# Patient Record
Sex: Male | Born: 1990 | Race: Black or African American | Hispanic: No | Marital: Single | Smoking: Former smoker
Health system: Southern US, Community
[De-identification: ages and names within clinical notes are randomized; demographics above are authoritative.]

## PROBLEM LIST (undated history)

## (undated) DIAGNOSIS — Y249XXD Unspecified firearm discharge, undetermined intent, subsequent encounter: Secondary | ICD-10-CM

## (undated) DIAGNOSIS — W3400XD Accidental discharge from unspecified firearms or gun, subsequent encounter: Secondary | ICD-10-CM

---

## 2010-08-12 ENCOUNTER — Emergency Department (HOSPITAL_COMMUNITY)
Admission: EM | Admit: 2010-08-12 | Discharge: 2010-08-12 | Payer: Self-pay | Source: Home / Self Care | Admitting: Emergency Medicine

## 2010-11-13 LAB — ETHANOL: Alcohol, Ethyl (B): 234 mg/dL — ABNORMAL HIGH (ref 0–10)

## 2012-09-18 ENCOUNTER — Emergency Department: Payer: Self-pay | Admitting: Emergency Medicine

## 2015-01-12 ENCOUNTER — Inpatient Hospital Stay (HOSPITAL_COMMUNITY)
Admission: EM | Admit: 2015-01-12 | Discharge: 2015-01-19 | DRG: 184 | Disposition: A | Discharge status: Home / Self Care | Payer: PRIVATE HEALTH INSURANCE | Attending: General Surgery | Admitting: General Surgery

## 2015-01-12 DIAGNOSIS — S299XXA Unspecified injury of thorax, initial encounter: Secondary | ICD-10-CM

## 2015-01-12 DIAGNOSIS — W3400XA Accidental discharge from unspecified firearms or gun, initial encounter: Secondary | ICD-10-CM

## 2015-01-12 DIAGNOSIS — S21139A Puncture wound without foreign body of unspecified front wall of thorax without penetration into thoracic cavity, initial encounter: Secondary | ICD-10-CM

## 2015-01-12 DIAGNOSIS — S2241XA Multiple fractures of ribs, right side, initial encounter for closed fracture: Principal | ICD-10-CM | POA: Diagnosis present

## 2015-01-12 DIAGNOSIS — M62838 Other muscle spasm: Secondary | ICD-10-CM | POA: Diagnosis present

## 2015-01-12 DIAGNOSIS — S41001A Unspecified open wound of right shoulder, initial encounter: Secondary | ICD-10-CM | POA: Diagnosis present

## 2015-01-12 DIAGNOSIS — S41039A Puncture wound without foreign body of unspecified shoulder, initial encounter: Secondary | ICD-10-CM

## 2015-01-12 DIAGNOSIS — J942 Hemothorax: Secondary | ICD-10-CM | POA: Diagnosis present

## 2015-01-12 DIAGNOSIS — D62 Acute posthemorrhagic anemia: Secondary | ICD-10-CM | POA: Diagnosis present

## 2015-01-12 DIAGNOSIS — T797XXA Traumatic subcutaneous emphysema, initial encounter: Secondary | ICD-10-CM | POA: Diagnosis present

## 2015-01-12 DIAGNOSIS — S279XXA Injury of unspecified intrathoracic organ, initial encounter: Secondary | ICD-10-CM | POA: Diagnosis present

## 2015-01-12 DIAGNOSIS — S272XXA Traumatic hemopneumothorax, initial encounter: Secondary | ICD-10-CM | POA: Diagnosis present

## 2015-01-12 DIAGNOSIS — R40241 Glasgow coma scale score 13-15: Secondary | ICD-10-CM | POA: Diagnosis present

## 2015-01-12 DIAGNOSIS — S71139A Puncture wound without foreign body, unspecified thigh, initial encounter: Secondary | ICD-10-CM

## 2015-01-12 DIAGNOSIS — S71101A Unspecified open wound, right thigh, initial encounter: Secondary | ICD-10-CM | POA: Diagnosis present

## 2015-01-13 ENCOUNTER — Emergency Department (HOSPITAL_COMMUNITY): Payer: PRIVATE HEALTH INSURANCE

## 2015-01-13 ENCOUNTER — Encounter (HOSPITAL_COMMUNITY): Payer: Self-pay | Admitting: Radiology

## 2015-01-13 DIAGNOSIS — M62838 Other muscle spasm: Secondary | ICD-10-CM | POA: Diagnosis present

## 2015-01-13 DIAGNOSIS — W3400XA Accidental discharge from unspecified firearms or gun, initial encounter: Secondary | ICD-10-CM

## 2015-01-13 DIAGNOSIS — S21139A Puncture wound without foreign body of unspecified front wall of thorax without penetration into thoracic cavity, initial encounter: Secondary | ICD-10-CM

## 2015-01-13 DIAGNOSIS — S41001A Unspecified open wound of right shoulder, initial encounter: Secondary | ICD-10-CM | POA: Diagnosis present

## 2015-01-13 DIAGNOSIS — R40241 Glasgow coma scale score 13-15: Secondary | ICD-10-CM | POA: Diagnosis present

## 2015-01-13 DIAGNOSIS — J942 Hemothorax: Secondary | ICD-10-CM | POA: Diagnosis present

## 2015-01-13 DIAGNOSIS — D62 Acute posthemorrhagic anemia: Secondary | ICD-10-CM | POA: Diagnosis present

## 2015-01-13 DIAGNOSIS — S2241XA Multiple fractures of ribs, right side, initial encounter for closed fracture: Secondary | ICD-10-CM | POA: Diagnosis present

## 2015-01-13 DIAGNOSIS — T797XXA Traumatic subcutaneous emphysema, initial encounter: Secondary | ICD-10-CM | POA: Diagnosis present

## 2015-01-13 DIAGNOSIS — S279XXA Injury of unspecified intrathoracic organ, initial encounter: Secondary | ICD-10-CM | POA: Diagnosis present

## 2015-01-13 DIAGNOSIS — S71101A Unspecified open wound, right thigh, initial encounter: Secondary | ICD-10-CM | POA: Diagnosis present

## 2015-01-13 LAB — BASIC METABOLIC PANEL
ANION GAP: 10 (ref 5–15)
BUN: 12 mg/dL (ref 6–20)
CO2: 22 mmol/L (ref 22–32)
Calcium: 7.7 mg/dL — ABNORMAL LOW (ref 8.9–10.3)
Chloride: 104 mmol/L (ref 101–111)
Creatinine, Ser: 1.35 mg/dL — ABNORMAL HIGH (ref 0.61–1.24)
GFR calc Af Amer: >60 mL/min (ref >60)
GFR calc non Af Amer: >60 mL/min (ref >60)
Glucose, Bld: 198 mg/dL — ABNORMAL HIGH (ref 65–99)
Potassium: 4.3 mmol/L (ref 3.5–5.1)
SODIUM: 136 mmol/L (ref 135–145)

## 2015-01-13 LAB — COMPREHENSIVE METABOLIC PANEL
ALT: 29 U/L (ref 17–63)
AST: 42 U/L — AB (ref 15–41)
Albumin: 3.8 g/dL (ref 3.5–5.0)
Alkaline Phosphatase: 57 U/L (ref 38–126)
Anion gap: 31 — ABNORMAL HIGH (ref 5–15)
BUN: 13 mg/dL (ref 6–20)
CALCIUM: 9 mg/dL (ref 8.9–10.3)
CO2: 8 mmol/L — ABNORMAL LOW (ref 22–32)
Chloride: 101 mmol/L (ref 101–111)
Creatinine, Ser: 2.07 mg/dL — ABNORMAL HIGH (ref 0.61–1.24)
GFR calc Af Amer: 50 mL/min — ABNORMAL LOW (ref >60)
GFR calc non Af Amer: 43 mL/min — ABNORMAL LOW (ref >60)
Glucose, Bld: 220 mg/dL — ABNORMAL HIGH (ref 65–99)
Potassium: 3.6 mmol/L (ref 3.5–5.1)
Sodium: 140 mmol/L (ref 135–145)
TOTAL PROTEIN: 6.5 g/dL (ref 6.5–8.1)
Total Bilirubin: 0.3 mg/dL (ref 0.3–1.2)

## 2015-01-13 LAB — PREPARE FRESH FROZEN PLASMA
UNIT DIVISION: 0
Unit division: 0

## 2015-01-13 LAB — CBC
HEMATOCRIT: 39.9 % (ref 39.0–52.0)
HEMATOCRIT: 42.2 % (ref 39.0–52.0)
HEMOGLOBIN: 13.1 g/dL (ref 13.0–17.0)
HEMOGLOBIN: 13.1 g/dL (ref 13.0–17.0)
MCH: 28.9 pg (ref 26.0–34.0)
MCH: 29 pg (ref 26.0–34.0)
MCHC: 31 g/dL (ref 30.0–36.0)
MCHC: 32.8 g/dL (ref 30.0–36.0)
MCV: 88.3 fL (ref 78.0–100.0)
MCV: 93.2 fL (ref 78.0–100.0)
Platelets: 218 10*3/uL (ref 150–400)
Platelets: 276 10*3/uL (ref 150–400)
RBC: 4.52 MIL/uL (ref 4.22–5.81)
RBC: 4.53 MIL/uL (ref 4.22–5.81)
RDW: 14.1 % (ref 11.5–15.5)
RDW: 15 % (ref 11.5–15.5)
WBC: 12.5 10*3/uL — ABNORMAL HIGH (ref 4.0–10.5)
WBC: 24.1 10*3/uL — ABNORMAL HIGH (ref 4.0–10.5)

## 2015-01-13 LAB — I-STAT CG4 LACTIC ACID, ED

## 2015-01-13 LAB — ABO/RH: ABO/RH(D): O POS

## 2015-01-13 LAB — ETHANOL: ALCOHOL ETHYL (B): 96 mg/dL — AB (ref <5)

## 2015-01-13 LAB — PROTIME-INR
INR: 1.22 (ref 0.00–1.49)
Prothrombin Time: 15.5 seconds — ABNORMAL HIGH (ref 11.6–15.2)

## 2015-01-13 LAB — MRSA PCR SCREENING: MRSA by PCR: NEGATIVE

## 2015-01-13 LAB — CDS SEROLOGY

## 2015-01-13 MED ORDER — OXYCODONE HCL 5 MG PO TABS
5.0000 mg | ORAL_TABLET | ORAL | Status: DC | PRN
  Administered 2015-01-13 – 2015-01-15 (×7): 5 mg via ORAL
  Filled 2015-01-13 (×7): qty 1

## 2015-01-13 MED ORDER — ONDANSETRON HCL 4 MG/2ML IJ SOLN: 4.0000 mg | Freq: Once | INTRAMUSCULAR | Status: AC

## 2015-01-13 MED ORDER — OXYCODONE HCL 5 MG PO TABS
10.0000 mg | ORAL_TABLET | ORAL | Status: DC | PRN
  Administered 2015-01-16 – 2015-01-17 (×4): 10 mg via ORAL
  Filled 2015-01-13 (×4): qty 2

## 2015-01-13 MED ORDER — CEFAZOLIN SODIUM-DEXTROSE 2-3 GM-% IV SOLR
2.0000 g | Freq: Once | INTRAVENOUS | Status: AC
  Administered 2015-01-13: 2 g via INTRAVENOUS
  Filled 2015-01-13: qty 50

## 2015-01-13 MED ORDER — FENTANYL CITRATE (PF) 100 MCG/2ML IJ SOLN
INTRAMUSCULAR | Status: AC | PRN
  Administered 2015-01-13: 100 ug via INTRAVENOUS

## 2015-01-13 MED ORDER — HYDROMORPHONE HCL 1 MG/ML IJ SOLN
1.0000 mg | INTRAMUSCULAR | Status: DC | PRN
  Administered 2015-01-13 (×5): 1 mg via INTRAVENOUS
  Administered 2015-01-14: 2 mg via INTRAVENOUS
  Administered 2015-01-14: 1 mg via INTRAVENOUS
  Administered 2015-01-14 (×2): 2 mg via INTRAVENOUS
  Administered 2015-01-14 – 2015-01-15 (×4): 1 mg via INTRAVENOUS
  Administered 2015-01-15: 2 mg via INTRAVENOUS
  Administered 2015-01-15 – 2015-01-16 (×3): 1 mg via INTRAVENOUS
  Filled 2015-01-13: qty 2
  Filled 2015-01-13 (×2): qty 1
  Filled 2015-01-13: qty 2
  Filled 2015-01-13: qty 1
  Filled 2015-01-13 (×2): qty 2
  Filled 2015-01-13 (×3): qty 1
  Filled 2015-01-13 (×2): qty 2
  Filled 2015-01-13: qty 1
  Filled 2015-01-13: qty 2
  Filled 2015-01-13: qty 1

## 2015-01-13 MED ORDER — SODIUM CHLORIDE 0.9 % IV SOLN
INTRAVENOUS | Status: AC | PRN
  Administered 2015-01-13 (×2): 1000 mL via INTRAVENOUS

## 2015-01-13 MED ORDER — IOHEXOL 300 MG/ML  SOLN
100.0000 mL | Freq: Once | INTRAMUSCULAR | Status: AC | PRN
  Administered 2015-01-13: 100 mL via INTRAVENOUS

## 2015-01-13 MED ORDER — PANTOPRAZOLE SODIUM 40 MG IV SOLR
40.0000 mg | Freq: Every day | INTRAVENOUS | Status: DC
  Administered 2015-01-13 – 2015-01-15 (×3): 40 mg via INTRAVENOUS
  Filled 2015-01-13 (×6): qty 40

## 2015-01-13 MED ORDER — PANTOPRAZOLE SODIUM 40 MG PO TBEC
40.0000 mg | DELAYED_RELEASE_TABLET | Freq: Every day | ORAL | Status: DC
  Administered 2015-01-16: 40 mg via ORAL
  Filled 2015-01-13: qty 1

## 2015-01-13 MED ORDER — ONDANSETRON HCL 4 MG/2ML IJ SOLN
INTRAMUSCULAR | Status: AC
  Filled 2015-01-13: qty 2

## 2015-01-13 MED ORDER — ONDANSETRON HCL 4 MG PO TABS: 4.0000 mg | ORAL_TABLET | Freq: Four times a day (QID) | ORAL | Status: DC | PRN

## 2015-01-13 MED ORDER — FENTANYL CITRATE (PF) 100 MCG/2ML IJ SOLN
INTRAMUSCULAR | Status: AC
  Filled 2015-01-13: qty 2

## 2015-01-13 MED ORDER — DEXTROSE IN LACTATED RINGERS 5 % IV SOLN
INTRAVENOUS | Status: DC
  Administered 2015-01-13 (×3): via INTRAVENOUS
  Administered 2015-01-14 (×2): 125 mL/h via INTRAVENOUS
  Administered 2015-01-15: 50 mL/h via INTRAVENOUS
  Filled 2015-01-13: qty 1000

## 2015-01-13 MED ORDER — ONDANSETRON HCL 4 MG/2ML IJ SOLN: 4.0000 mg | Freq: Four times a day (QID) | INTRAMUSCULAR | Status: DC | PRN

## 2015-01-13 MED ORDER — HYDROMORPHONE HCL 1 MG/ML IJ SOLN
INTRAMUSCULAR | Status: AC
  Filled 2015-01-13: qty 1

## 2015-01-13 MED ORDER — ACETAMINOPHEN 325 MG PO TABS
650.0000 mg | ORAL_TABLET | ORAL | Status: DC | PRN
  Administered 2015-01-14: 650 mg via ORAL
  Filled 2015-01-13: qty 2

## 2015-01-13 MED ORDER — METHOCARBAMOL 1000 MG/10ML IJ SOLN
500.0000 mg | Freq: Four times a day (QID) | INTRAVENOUS | Status: DC | PRN
  Administered 2015-01-13: 500 mg via INTRAVENOUS
  Filled 2015-01-13 (×3): qty 5
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
## 2015-01-13 NOTE — Progress Notes (Signed)
UR completed.  Pt likely to need DME for d/c.  Will not qualify for HHPT/OT as he has no fractures or brain injury that would allow for Medicaid level of HH.  CM to follow for DME needs/recommendations.  Carlyle LipaMichelle Donald Memoli, RN BSN MHA CCM Trauma/Neuro ICU Case Manager [phone]
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
## 2015-01-14 ENCOUNTER — Inpatient Hospital Stay (HOSPITAL_COMMUNITY): Payer: PRIVATE HEALTH INSURANCE

## 2015-01-14 LAB — CBC WITH DIFFERENTIAL/PLATELET
Basophils Absolute: 0 10*3/uL (ref 0.0–0.1)
Basophils Relative: 0 % (ref 0–1)
Eosinophils Absolute: 0 10*3/uL (ref 0.0–0.7)
Eosinophils Relative: 0 % (ref 0–5)
HCT: 26.4 % — ABNORMAL LOW (ref 39.0–52.0)
Hemoglobin: 8.8 g/dL — ABNORMAL LOW (ref 13.0–17.0)
LYMPHS ABS: 2.7 10*3/uL (ref 0.7–4.0)
LYMPHS PCT: 23 % (ref 12–46)
MCH: 29.4 pg (ref 26.0–34.0)
MCHC: 33.3 g/dL (ref 30.0–36.0)
MCV: 88.3 fL (ref 78.0–100.0)
MONO ABS: 0.8 10*3/uL (ref 0.1–1.0)
MONOS PCT: 7 % (ref 3–12)
Neutro Abs: 8 10*3/uL — ABNORMAL HIGH (ref 1.7–7.7)
Neutrophils Relative %: 69 % (ref 43–77)
PLATELETS: 160 10*3/uL (ref 150–400)
RBC: 2.99 MIL/uL — ABNORMAL LOW (ref 4.22–5.81)
RDW: 15 % (ref 11.5–15.5)
WBC: 11.5 10*3/uL — ABNORMAL HIGH (ref 4.0–10.5)

## 2015-01-14 LAB — BASIC METABOLIC PANEL
ANION GAP: 7 (ref 5–15)
BUN: 7 mg/dL (ref 6–20)
CALCIUM: 8 mg/dL — AB (ref 8.9–10.3)
CO2: 28 mmol/L (ref 22–32)
Chloride: 97 mmol/L — ABNORMAL LOW (ref 101–111)
Creatinine, Ser: 1.31 mg/dL — ABNORMAL HIGH (ref 0.61–1.24)
GFR calc Af Amer: >60 mL/min (ref >60)
GFR calc non Af Amer: >60 mL/min (ref >60)
GLUCOSE: 152 mg/dL — AB (ref 65–99)
Potassium: 3.7 mmol/L (ref 3.5–5.1)
SODIUM: 132 mmol/L — AB (ref 135–145)

## 2015-01-14 LAB — CBC
HEMATOCRIT: 21 % — AB (ref 39.0–52.0)
Hemoglobin: 6.9 g/dL — CL (ref 13.0–17.0)
MCH: 29.2 pg (ref 26.0–34.0)
MCHC: 32.9 g/dL (ref 30.0–36.0)
MCV: 89 fL (ref 78.0–100.0)
Platelets: 136 10*3/uL — ABNORMAL LOW (ref 150–400)
RBC: 2.36 MIL/uL — ABNORMAL LOW (ref 4.22–5.81)
RDW: 14.5 % (ref 11.5–15.5)
WBC: 11.2 10*3/uL — ABNORMAL HIGH (ref 4.0–10.5)

## 2015-01-14 LAB — BLOOD PRODUCT ORDER (VERBAL) VERIFICATION

## 2015-01-14 MED ORDER — ACETAMINOPHEN 325 MG PO TABS: 650.0000 mg | ORAL_TABLET | ORAL | Status: DC | PRN

## 2015-01-14 MED ORDER — SODIUM CHLORIDE 0.9 % IV SOLN
Freq: Once | INTRAVENOUS | Status: AC
  Administered 2015-01-14: 22:00:00 via INTRAVENOUS

## 2015-01-14 MED ORDER — METOPROLOL TARTRATE 1 MG/ML IV SOLN
5.0000 mg | Freq: Once | INTRAVENOUS | Status: AC
  Administered 2015-01-14: 5 mg via INTRAVENOUS
  Filled 2015-01-14: qty 5

## 2015-01-14 MED ORDER — LACTATED RINGERS IV BOLUS (SEPSIS)
1000.0000 mL | Freq: Once | INTRAVENOUS | Status: AC
  Administered 2015-01-14: 1000 mL via INTRAVENOUS
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
## 2015-01-15 ENCOUNTER — Inpatient Hospital Stay (HOSPITAL_COMMUNITY): Payer: PRIVATE HEALTH INSURANCE

## 2015-01-15 LAB — TYPE AND SCREEN
ABO/RH(D): O POS
Antibody Screen: NEGATIVE
UNIT DIVISION: 0
Unit division: 0
Unit division: 0
Unit division: 0

## 2015-01-15 LAB — CBC
HCT: 29.3 % — ABNORMAL LOW (ref 39.0–52.0)
Hemoglobin: 9.7 g/dL — ABNORMAL LOW (ref 13.0–17.0)
MCH: 28.6 pg (ref 26.0–34.0)
MCHC: 33.1 g/dL (ref 30.0–36.0)
MCV: 86.4 fL (ref 78.0–100.0)
Platelets: 160 10*3/uL (ref 150–400)
RBC: 3.39 MIL/uL — ABNORMAL LOW (ref 4.22–5.81)
RDW: 15.4 % (ref 11.5–15.5)
WBC: 10.6 10*3/uL — ABNORMAL HIGH (ref 4.0–10.5)

## 2015-01-15 LAB — PREPARE RBC (CROSSMATCH)

## 2015-01-15 MED ORDER — BACITRACIN-NEOMYCIN-POLYMYXIN OINTMENT TUBE
1.0000 "application " | TOPICAL_OINTMENT | Freq: Two times a day (BID) | CUTANEOUS | Status: DC
  Administered 2015-01-15 – 2015-01-19 (×9): 1 via TOPICAL
  Filled 2015-01-15 (×2): qty 15

## 2015-01-15 MED ORDER — DOCUSATE SODIUM 100 MG PO CAPS
100.0000 mg | ORAL_CAPSULE | Freq: Two times a day (BID) | ORAL | Status: DC
  Administered 2015-01-15 – 2015-01-19 (×9): 100 mg via ORAL
  Filled 2015-01-15 (×9): qty 1

## 2015-01-15 MED ORDER — NICOTINE 14 MG/24HR TD PT24
14.0000 mg | MEDICATED_PATCH | Freq: Every day | TRANSDERMAL | Status: DC
  Administered 2015-01-15 – 2015-01-19 (×6): 14 mg via TRANSDERMAL
  Filled 2015-01-15 (×6): qty 1
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
## 2015-01-15 NOTE — Progress Notes (Signed)
CRITICAL VALUE ALERT  Critical value received:  HGB of 6.9  Date of notification:  01/14/15  Time of notification:  2100  Critical value read back: Yes  Nurse who received alert:  Lily Peerhomas Nayden Czajka   MD notified (1st page):  Dr. Donell BeersByerly  Time of first page:  2100  Responding MD:  Donell BeersByerly  Time MD responded:  2105  Orders received.

## 2015-01-16 ENCOUNTER — Inpatient Hospital Stay (HOSPITAL_COMMUNITY): Payer: PRIVATE HEALTH INSURANCE

## 2015-01-16 DIAGNOSIS — D62 Acute posthemorrhagic anemia: Secondary | ICD-10-CM | POA: Diagnosis not present

## 2015-01-16 DIAGNOSIS — S272XXA Traumatic hemopneumothorax, initial encounter: Secondary | ICD-10-CM | POA: Diagnosis present

## 2015-01-16 DIAGNOSIS — S2241XA Multiple fractures of ribs, right side, initial encounter for closed fracture: Secondary | ICD-10-CM | POA: Diagnosis present

## 2015-01-16 LAB — BASIC METABOLIC PANEL
ANION GAP: 8 (ref 5–15)
BUN: <5 mg/dL — ABNORMAL LOW (ref 6–20)
CO2: 29 mmol/L (ref 22–32)
CREATININE: 1.01 mg/dL (ref 0.61–1.24)
Calcium: 8.5 mg/dL — ABNORMAL LOW (ref 8.9–10.3)
Chloride: 97 mmol/L — ABNORMAL LOW (ref 101–111)
GFR calc Af Amer: >60 mL/min (ref >60)
GFR calc non Af Amer: >60 mL/min (ref >60)
Glucose, Bld: 118 mg/dL — ABNORMAL HIGH (ref 65–99)
Potassium: 3.8 mmol/L (ref 3.5–5.1)
Sodium: 134 mmol/L — ABNORMAL LOW (ref 135–145)

## 2015-01-16 LAB — CBC
HEMATOCRIT: 32.3 % — AB (ref 39.0–52.0)
Hemoglobin: 10.6 g/dL — ABNORMAL LOW (ref 13.0–17.0)
MCH: 28.3 pg (ref 26.0–34.0)
MCHC: 32.8 g/dL (ref 30.0–36.0)
MCV: 86.4 fL (ref 78.0–100.0)
Platelets: 175 10*3/uL (ref 150–400)
RBC: 3.74 MIL/uL — AB (ref 4.22–5.81)
RDW: 14.8 % (ref 11.5–15.5)
WBC: 10.9 10*3/uL — AB (ref 4.0–10.5)

## 2015-01-16 LAB — MAGNESIUM: Magnesium: 1.7 mg/dL (ref 1.7–2.4)

## 2015-01-16 MED ORDER — BACITRACIN-NEOMYCIN-POLYMYXIN 400-5-5000 EX OINT
TOPICAL_OINTMENT | CUTANEOUS | Status: AC
  Filled 2015-01-16: qty 1

## 2015-01-16 MED ORDER — SODIUM CHLORIDE 0.9 % IJ SOLN: 3.0000 mL | INTRAMUSCULAR | Status: DC | PRN

## 2015-01-16 NOTE — Progress Notes (Signed)
UR completed.  No HH needs anticipated at d/c.  Carlyle LipaMichelle Cozetta Seif, RN BSN MHA CCM Trauma/Neuro ICU Case Manager [phone]

## 2015-01-16 NOTE — Progress Notes (Signed)
Patient to transfer to [id number] report given to receiving nurse Aurea GraffJoan, all questions answered at this time.  Pt. VSS with no s/s of distress noted.  Pt. Stable at transfer.
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
## 2015-01-17 ENCOUNTER — Inpatient Hospital Stay (HOSPITAL_COMMUNITY): Payer: PRIVATE HEALTH INSURANCE

## 2015-01-17 ENCOUNTER — Encounter (HOSPITAL_COMMUNITY): Payer: Self-pay | Admitting: *Deleted

## 2015-01-17 DIAGNOSIS — S41039A Puncture wound without foreign body of unspecified shoulder, initial encounter: Secondary | ICD-10-CM

## 2015-01-17 DIAGNOSIS — W3400XA Accidental discharge from unspecified firearms or gun, initial encounter: Secondary | ICD-10-CM

## 2015-01-17 DIAGNOSIS — S71139A Puncture wound without foreign body, unspecified thigh, initial encounter: Secondary | ICD-10-CM

## 2015-01-17 LAB — CBC WITH DIFFERENTIAL/PLATELET
Basophils Absolute: 0 10*3/uL (ref 0.0–0.1)
Basophils Relative: 0 % (ref 0–1)
EOS PCT: 1 % (ref 0–5)
Eosinophils Absolute: 0.2 10*3/uL (ref 0.0–0.7)
HCT: 32.2 % — ABNORMAL LOW (ref 39.0–52.0)
HEMOGLOBIN: 10.6 g/dL — AB (ref 13.0–17.0)
LYMPHS ABS: 2.6 10*3/uL (ref 0.7–4.0)
Lymphocytes Relative: 22 % (ref 12–46)
MCH: 28.6 pg (ref 26.0–34.0)
MCHC: 32.9 g/dL (ref 30.0–36.0)
MCV: 86.8 fL (ref 78.0–100.0)
Monocytes Absolute: 1.6 10*3/uL — ABNORMAL HIGH (ref 0.1–1.0)
Monocytes Relative: 14 % — ABNORMAL HIGH (ref 3–12)
Neutro Abs: 7.3 10*3/uL (ref 1.7–7.7)
Neutrophils Relative %: 63 % (ref 43–77)
Platelets: 265 10*3/uL (ref 150–400)
RBC: 3.71 MIL/uL — AB (ref 4.22–5.81)
RDW: 14.3 % (ref 11.5–15.5)
WBC: 11.6 10*3/uL — AB (ref 4.0–10.5)

## 2015-01-17 MED ORDER — ENOXAPARIN SODIUM 40 MG/0.4ML ~~LOC~~ SOLN
40.0000 mg | Freq: Every day | SUBCUTANEOUS | Status: DC
  Administered 2015-01-17 – 2015-01-19 (×3): 40 mg via SUBCUTANEOUS
  Filled 2015-01-17 (×3): qty 0.4

## 2015-01-17 MED ORDER — POLYETHYLENE GLYCOL 3350 17 G PO PACK
17.0000 g | PACK | Freq: Every day | ORAL | Status: DC
  Administered 2015-01-17 – 2015-01-19 (×3): 17 g via ORAL
  Filled 2015-01-17 (×3): qty 1

## 2015-01-17 MED ORDER — BACITRACIN-NEOMYCIN-POLYMYXIN 400-5-5000 EX OINT
TOPICAL_OINTMENT | CUTANEOUS | Status: AC
  Filled 2015-01-17: qty 1

## 2015-01-17 MED ORDER — HYDROMORPHONE HCL 1 MG/ML IJ SOLN
0.5000 mg | INTRAMUSCULAR | Status: DC | PRN
  Administered 2015-01-18 – 2015-01-19 (×2): 0.5 mg via INTRAVENOUS
  Filled 2015-01-17 (×2): qty 1

## 2015-01-17 MED ORDER — OXYCODONE HCL 5 MG PO TABS
5.0000 mg | ORAL_TABLET | ORAL | Status: DC | PRN
  Administered 2015-01-17 – 2015-01-18 (×6): 15 mg via ORAL
  Administered 2015-01-19: 10 mg via ORAL
  Administered 2015-01-19 (×2): 15 mg via ORAL
  Filled 2015-01-17 (×8): qty 3
  Filled 2015-01-17: qty 2
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
## 2015-01-18 ENCOUNTER — Inpatient Hospital Stay (HOSPITAL_COMMUNITY): Payer: PRIVATE HEALTH INSURANCE

## 2015-01-18 NOTE — Clinical Social Work Note (Signed)
CSW provided patient with packet of information on acute stress reaction. Per PT patient should not have any PT needs at discharge.   Roddie McBryant Saphronia Ozdemir MSW, EdnaLCSWA, YetterLCASA, [phone]
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
## 2015-01-19 ENCOUNTER — Inpatient Hospital Stay (HOSPITAL_COMMUNITY): Payer: PRIVATE HEALTH INSURANCE

## 2015-01-19 MED ORDER — OXYCODONE-ACETAMINOPHEN 7.5-325 MG PO TABS: 1.0000 | ORAL_TABLET | ORAL | Status: DC | PRN
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
## 2015-01-19 NOTE — Discharge Instructions (Signed)
Leave chest dressing on until Saturday, then Wash wounds daily in shower with soap and water. Do not soak. Apply antibiotic ointment (e.g. Neosporin) twice daily and as needed to keep moist. Cover with dry dressing.  No driving while taking oxycodone.
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
## 2015-01-19 NOTE — Progress Notes (Signed)
Chest tube incision draining. New dressing applied to site. Patient discharged home. Patient  understands discharge instructions instructions.

## 2015-01-25 ENCOUNTER — Encounter (HOSPITAL_COMMUNITY): Payer: Self-pay | Admitting: Emergency Medicine

## 2015-01-25 ENCOUNTER — Emergency Department (INDEPENDENT_AMBULATORY_CARE_PROVIDER_SITE_OTHER): Payer: Self-pay

## 2015-01-25 ENCOUNTER — Emergency Department (INDEPENDENT_AMBULATORY_CARE_PROVIDER_SITE_OTHER)
Admission: EM | Admit: 2015-01-25 | Discharge: 2015-01-25 | Disposition: A | Discharge status: Home / Self Care | Payer: PRIVATE HEALTH INSURANCE | Source: Home / Self Care | Attending: Family Medicine | Admitting: Family Medicine

## 2015-01-25 DIAGNOSIS — S71101S Unspecified open wound, right thigh, sequela: Secondary | ICD-10-CM | POA: Diagnosis not present

## 2015-01-25 DIAGNOSIS — W3400XS Accidental discharge from unspecified firearms or gun, sequela: Secondary | ICD-10-CM | POA: Diagnosis not present

## 2015-01-25 DIAGNOSIS — S71131S Puncture wound without foreign body, right thigh, sequela: Secondary | ICD-10-CM

## 2015-01-25 DIAGNOSIS — S41031S Puncture wound without foreign body of right shoulder, sequela: Principal | ICD-10-CM

## 2015-01-25 DIAGNOSIS — S41001S Unspecified open wound of right shoulder, sequela: Secondary | ICD-10-CM | POA: Diagnosis not present

## 2015-01-25 DIAGNOSIS — S2241XS Multiple fractures of ribs, right side, sequela: Secondary | ICD-10-CM

## 2015-01-25 HISTORY — DX: Accidental discharge from unspecified firearms or gun, subsequent encounter: W34.00XD

## 2015-01-25 HISTORY — DX: Unspecified firearm discharge, undetermined intent, subsequent encounter: Y24.9XXD

## 2015-01-25 MED ORDER — NAPROXEN 500 MG PO TABS: 500.0000 mg | ORAL_TABLET | Freq: Two times a day (BID) | ORAL | Status: DC

## 2015-01-25 MED ORDER — OXYCODONE-ACETAMINOPHEN 7.5-325 MG PO TABS
1.0000 | ORAL_TABLET | Freq: Four times a day (QID) | ORAL | Status: DC | PRN
Start: 2015-01-25 — End: 2019-06-07

## 2015-01-25 NOTE — Discharge Instructions (Signed)
Thank you for coming in today. Call [phone] to get a trauma appointment.  Return as needed.  Use the incentive spirometer every 15 minutes while awake. Go to the emergency room if he developed a fever.  Atelectasis Atelectasis is a collapse of the small air sacs in the lungs (alveoli). When this occurs, all or part of a lung collapses and becomes airless. It can be caused by various things and is a common problem after surgery. The severity of atelectasis will vary depending on the size of the area involved and the underlying cause of the condition. CAUSES  There are multiple causes for atelectasis:   Shallow breathing, particularly if there is an injury to your chest wall or abdomen that makes it painful to take a deep breath. This commonly occurs after surgery.  Obstruction of your airways (bronchi or bronchioles). This may be caused by a buildup of mucus (mucus plug), tumors, blood clots (pulmonary embolus), or inhaled foreign bodies. Mucus plugs occur when the lungs do not expand enough to get rid of mucus.  Outside pressure on the lung. This may be caused by tumors, fluid (pleural effusion), or a leakage of air between the lung and rib cage (pneumothorax).   Infections such as pneumonia.  Scarring in lung tissue left over from previous infection or injury.  Some diseases such as cystic fibrosis. SIGNS AND SYMPTOMS  Often, atelectasis will have no symptoms. When symptoms occur, they include:  Shortness of breath.   Bluish color to your nails, lips, or mouth (cyanosis). DIAGNOSIS  Your health care provider may suspect atelectasis based on symptoms and physical findings. A chest X-ray may be done to confirm the diagnosis. More specialized X-ray exams are sometimes required.  TREATMENT  Treatment will depend on the cause of the atelectasis. Treatment may include:  Purposeful coughing to loosen mucus plugs in the lungs.  Chest physiotherapy. This consists of clapping or  percussion on the chest over the lungs to further loosen mucus plugs.  Postural drainage techniques. This involves positioning your body so your head is lower than your chest. HOME CARE INSTRUCTIONS  Practice relaxed deep breathing whenever you are sitting down. A good technique is to take a few relaxed deep breaths each time a commercial comes on if you are watching television.  If you were given a deep breathing device (such as an incentive spirometer) or a mucus clearance device, use this regularly as directed by your health care provider.  Try to cough several times a day as directed by your health care provider.  Perform any chest physiotherapy or postural drainage techniques as directed by your health care provider. If necessary, have someone (such as a family member) assist you with these techniques.  When lying down, lie on the unaffected side to encourage mucus drainage.  Stay physically active as much as possible. SEEK IMMEDIATE MEDICAL CARE IF:   You develop increasing problems with your breathing.   You develop severe chest pain.   You develop severe coughing, or you cough up blood.   You have a fever or persistent symptoms for more than 2-3 days.   You have a fever and your symptoms suddenly get worse.  MAKE SURE YOU:  Understand these instructions.  Will watch your condition.  Will get help right away if you are not doing well or get worse. Document Released: 08/19/2005 Document Revised: 08/24/2013 Document Reviewed: 02/24/2013 [facility] Patient Information 2015 [employer] [place]. This information is not intended to replace advice given to you  by your health care provider. Make sure you discuss any questions you have with your health care provider. ° °
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
## 2015-01-26 ENCOUNTER — Emergency Department (HOSPITAL_COMMUNITY): Payer: PRIVATE HEALTH INSURANCE

## 2015-01-26 ENCOUNTER — Emergency Department (HOSPITAL_COMMUNITY)
Admission: EM | Admit: 2015-01-26 | Discharge: 2015-01-26 | Disposition: A | Discharge status: Home / Self Care | Payer: PRIVATE HEALTH INSURANCE | Attending: Emergency Medicine | Admitting: Emergency Medicine

## 2015-01-26 ENCOUNTER — Encounter (HOSPITAL_COMMUNITY): Payer: Self-pay | Admitting: *Deleted

## 2015-01-26 DIAGNOSIS — Z87828 Personal history of other (healed) physical injury and trauma: Secondary | ICD-10-CM | POA: Diagnosis not present

## 2015-01-26 DIAGNOSIS — R079 Chest pain, unspecified: Secondary | ICD-10-CM

## 2015-01-26 DIAGNOSIS — Z720 Tobacco use: Secondary | ICD-10-CM | POA: Diagnosis not present

## 2015-01-26 LAB — BASIC METABOLIC PANEL
ANION GAP: 9 (ref 5–15)
BUN: 15 mg/dL (ref 6–20)
CALCIUM: 9.2 mg/dL (ref 8.9–10.3)
CHLORIDE: 107 mmol/L (ref 101–111)
CO2: 25 mmol/L (ref 22–32)
CREATININE: 1.03 mg/dL (ref 0.61–1.24)
Glucose, Bld: 118 mg/dL — ABNORMAL HIGH (ref 65–99)
Potassium: 4 mmol/L (ref 3.5–5.1)
Sodium: 141 mmol/L (ref 135–145)

## 2015-01-26 LAB — CBC WITH DIFFERENTIAL/PLATELET
BASOS ABS: 0 10*3/uL (ref 0.0–0.1)
BASOS PCT: 0 % (ref 0–1)
EOS ABS: 0.3 10*3/uL (ref 0.0–0.7)
Eosinophils Relative: 2 % (ref 0–5)
HCT: 37.6 % — ABNORMAL LOW (ref 39.0–52.0)
Hemoglobin: 11.6 g/dL — ABNORMAL LOW (ref 13.0–17.0)
LYMPHS PCT: 19 % (ref 12–46)
Lymphs Abs: 2.6 10*3/uL (ref 0.7–4.0)
MCH: 27.6 pg (ref 26.0–34.0)
MCHC: 30.9 g/dL (ref 30.0–36.0)
MCV: 89.3 fL (ref 78.0–100.0)
MONOS PCT: 4 % (ref 3–12)
Monocytes Absolute: 0.6 10*3/uL (ref 0.1–1.0)
NEUTROS PCT: 75 % (ref 43–77)
Neutro Abs: 10.4 10*3/uL — ABNORMAL HIGH (ref 1.7–7.7)
Platelets: 580 10*3/uL — ABNORMAL HIGH (ref 150–400)
RBC: 4.21 MIL/uL — AB (ref 4.22–5.81)
RDW: 14.8 % (ref 11.5–15.5)
WBC: 13.9 10*3/uL — ABNORMAL HIGH (ref 4.0–10.5)

## 2015-01-26 MED ORDER — HYDROMORPHONE HCL 1 MG/ML IJ SOLN
1.0000 mg | Freq: Once | INTRAMUSCULAR | Status: AC
  Administered 2015-01-26: 1 mg via INTRAMUSCULAR
  Filled 2015-01-26: qty 1

## 2015-01-26 MED ORDER — IOHEXOL 350 MG/ML SOLN
100.0000 mL | Freq: Once | INTRAVENOUS | Status: AC | PRN
  Administered 2015-01-26: 100 mL via INTRAVENOUS
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
## 2015-01-26 NOTE — Progress Notes (Signed)
Pt stated [facility] [place] [facility] and will return if need CM spoke with pt who [place].   CM discussed and provided written information for self pay pcps, discussed the importance of pcp vs EDP services for f/u care, www.needymeds.org, www.goodrx.com, discounted pharmacies and other [place] resources such as [facility] , [facility], affordable care act,  [place] med assist, financial assistance, self pay dental services, [place] med assist, DSS and  health department  Reviewed resources for [place] self pay pcps like [facility], [facility] [address], [facility], [facility], local urgent care centers, [facility], [facility], general medical clinics, [facility], [facility] plus others, medication resources, [facility] out patient pharmacies and housing Pt voiced understanding and appreciation of resources provided   Provided [facility] contact information
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
## 2018-06-06 ENCOUNTER — Other Ambulatory Visit: Payer: Self-pay

## 2018-06-06 ENCOUNTER — Emergency Department (HOSPITAL_COMMUNITY)
Admission: EM | Admit: 2018-06-06 | Discharge: 2018-06-06 | Disposition: A | Discharge status: Home / Self Care | Payer: PRIVATE HEALTH INSURANCE | Attending: Emergency Medicine | Admitting: Emergency Medicine

## 2018-06-06 ENCOUNTER — Encounter (HOSPITAL_COMMUNITY): Payer: Self-pay

## 2018-06-06 DIAGNOSIS — Z202 Contact with and (suspected) exposure to infections with a predominantly sexual mode of transmission: Secondary | ICD-10-CM | POA: Insufficient documentation

## 2018-06-06 DIAGNOSIS — F17210 Nicotine dependence, cigarettes, uncomplicated: Secondary | ICD-10-CM | POA: Insufficient documentation

## 2018-06-06 NOTE — ED Notes (Signed)
Patient verbalizes understanding of discharge instructions. Opportunity for questioning and answers were provided. Armband removed by staff, pt discharged from ED.  
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
## 2018-06-06 NOTE — Discharge Instructions (Signed)
Please follow up with PCP as needed. Please return to the ED if you experience any of the signs/symptoms we discussed.

## 2018-06-06 NOTE — ED Triage Notes (Signed)
Pt endorses that his significant other tested positive for herpes and they have been having unprotected sex this week. Pt has no sx.

## 2019-06-07 ENCOUNTER — Encounter (HOSPITAL_COMMUNITY): Payer: Self-pay

## 2019-06-07 ENCOUNTER — Ambulatory Visit (HOSPITAL_COMMUNITY)
Admission: EM | Admit: 2019-06-07 | Discharge: 2019-06-07 | Disposition: A | Discharge status: Home / Self Care | Payer: Self-pay | Attending: Family Medicine | Admitting: Family Medicine

## 2019-06-07 ENCOUNTER — Other Ambulatory Visit: Payer: Self-pay

## 2019-06-07 DIAGNOSIS — S61210A Laceration without foreign body of right index finger without damage to nail, initial encounter: Secondary | ICD-10-CM
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
## 2019-06-07 NOTE — Discharge Instructions (Signed)
Wound cleaned and wrapped. Keep clean and dry Watch for signs of infections to include, increased warmth, swelling and drainage.  Follow up as needed for continued or worsening symptoms

## 2019-06-07 NOTE — ED Triage Notes (Signed)
Pt states he cut his finger on a generator at work this morning.  ( index finger right hand.)

## 2020-09-08 ENCOUNTER — Other Ambulatory Visit: Payer: Self-pay

## 2020-09-08 DIAGNOSIS — Z20822 Contact with and (suspected) exposure to covid-19: Secondary | ICD-10-CM

## 2020-09-12 LAB — NOVEL CORONAVIRUS, NAA: SARS-CoV-2, NAA: DETECTED — AB

## 2023-09-05 ENCOUNTER — Ambulatory Visit: Payer: No Typology Code available for payment source

## 2023-09-05 ENCOUNTER — Other Ambulatory Visit: Payer: Self-pay

## 2023-09-05 ENCOUNTER — Encounter (HOSPITAL_COMMUNITY): Payer: Self-pay

## 2023-09-05 ENCOUNTER — Ambulatory Visit
Admission: EM | Admit: 2023-09-05 | Discharge: 2023-09-05 | Disposition: A | Discharge status: Home / Self Care | Payer: Self-pay

## 2023-09-05 ENCOUNTER — Encounter: Payer: Self-pay | Admitting: Emergency Medicine

## 2023-09-05 ENCOUNTER — Emergency Department (HOSPITAL_COMMUNITY): Payer: No Typology Code available for payment source

## 2023-09-05 ENCOUNTER — Emergency Department (HOSPITAL_COMMUNITY)
Admission: EM | Admit: 2023-09-05 | Discharge: 2023-09-05 | Disposition: A | Discharge status: Home / Self Care | Payer: Self-pay | Attending: Emergency Medicine | Admitting: Emergency Medicine

## 2023-09-05 DIAGNOSIS — W228XXA Striking against or struck by other objects, initial encounter: Secondary | ICD-10-CM | POA: Insufficient documentation

## 2023-09-05 DIAGNOSIS — S99921A Unspecified injury of right foot, initial encounter: Secondary | ICD-10-CM

## 2023-09-05 DIAGNOSIS — S92521A Displaced fracture of medial phalanx of right lesser toe(s), initial encounter for closed fracture: Secondary | ICD-10-CM | POA: Insufficient documentation

## 2023-09-05 MED ORDER — HYDROCODONE-ACETAMINOPHEN 5-325 MG PO TABS
1.0000 | ORAL_TABLET | Freq: Once | ORAL | Status: AC
  Administered 2023-09-05: 1 via ORAL
  Filled 2023-09-05: qty 1

## 2023-09-05 MED ORDER — OXYCODONE HCL 5 MG PO TABS: 5.0000 mg | ORAL_TABLET | ORAL | 0 refills | Status: AC | PRN

## 2023-09-05 NOTE — ED Triage Notes (Signed)
 Pt reports he stubbed his right pinky toe on the wall today. Swelling noted to tow. He reports numbness and states he has to walk with a cane to ambulate. He was sent from Urgent Care.

## 2023-09-05 NOTE — Discharge Instructions (Signed)
 Please follow-up with your surgeon I have attached here for you today in regards recent ER visit.  Appears that you have a fracture in your right pinky toe that we buddy tape and given you crutches for.  Please take ibuprofen or Tylenol  every 6 hours needed for pain and ice your toes.  I have also attached a work note.  Pain not controlled by these medications you may use the oxycodone  I have prescribed.  If symptoms change or worsen please return to the ER.

## 2023-09-05 NOTE — ED Triage Notes (Signed)
 Pt stubbed R pinkie toe this morning. Pt reports numbness in the toe. Painful to walk requiring cane. Pt cannot move the R pinkie toe and has significant swelling. Toe is visually bent out of place.

## 2023-09-05 NOTE — Progress Notes (Signed)
 Orthopedic Tech Progress Note Patient Details:  Marco Wilson  1990/09/16 [id number]  Ortho Devices Type of Ortho Device: Crutches, Buddy tape Ortho Device/Splint Location: RLE 4th-5th toe Ortho Device/Splint Interventions: Ordered, Application, Adjustment   Post Interventions Patient Tolerated: Well Instructions Provided: Care of device  Marco Wilson MARLA Blush 09/05/2023, 8:56 PM

## 2023-09-05 NOTE — ED Provider Notes (Signed)
 [place] [facility] Provider Note   CSN: [id number] Arrival date & time: 09/05/23  1657     History  Chief Complaint  Patient presents with   Toe Injury    Marco Wilson  is a 33 y.o. male presenting with right pinky toe pain that began today.  Patient was at work when he stubbed his right pinky toe on the wall.  Patient states initially can feel still however his feelings come back.  Patient is able to bear weight but states that this hurts.  Patient was urgent care however was referred here.  Patient states that he has not taken any pain meds and does not see any bony protrusions.  Patient states he cannot move his toe due to the pain.  Patient denies any swelling or skin color changes or current paresthesias.  Home Medications Prior to Admission medications   Medication Sig Start Date End Date Taking? Authorizing Provider  oxyCODONE  (ROXICODONE ) 5 MG immediate release tablet Take 1 tablet (5 mg total) by mouth every 4 (four) hours as needed for up to 3 days for severe pain (pain score 7-10). 09/05/23 09/08/23 Yes Victor Lynwood DASEN, PA-C      Allergies    Patient has no known allergies.    Review of Systems   Review of Systems  Physical Exam Updated Vital Signs BP (!) 148/73   Pulse 91   Temp 98.2 F (36.8 C)   Resp 16   Ht 5' 8 (1.727 m)   Wt 74.8 kg   SpO2 97%   BMI 25.09 kg/m  Physical Exam Vitals reviewed.  Constitutional:      General: He is not in acute distress. Cardiovascular:     Rate and Rhythm: Normal rate.     Pulses: Normal pulses.  Musculoskeletal:     Comments: Right pinky toe: Tender to palpation without crepitus or step-offs, no overlying skin color changes, no bony protrusions, unable to range due to pain Able to bear weight Pain not out of proportion Soft compartments  Skin:    General: Skin is warm and dry.     Capillary Refill: Capillary refill takes less than 2 seconds.     Comments: No overlying skin  color changes or open wounds  Neurological:     Mental Status: He is alert.     Comments: Sensation intact distally  Psychiatric:        Mood and Affect: Mood normal.     ED Results / Procedures / Treatments   Labs (all labs ordered are listed, but only abnormal results are displayed) Labs Reviewed - No data to display  EKG None  Radiology No results found.  Procedures Procedures    Medications Ordered in ED Medications  HYDROcodone -acetaminophen  (NORCO/VICODIN) 5-325 MG per tablet 1 tablet (has no administration in time range)    ED Course/ Medical Decision Making/ A&P                                 Medical Decision Making Amount and/or Complexity of Data Reviewed Radiology: ordered.  Risk Prescription drug management.   Marco Wilson  33 y.o. presented today for right pinky toe pain. Working DDx that I considered at this time includes, but not limited to, contusion, strain/sprain, fracture, dislocation, neurovascular compromise, septic joint, ischemic limb, compartment syndrome.  R/o DDx: dislocation, neurovascular compromise, septic joint, ischemic limb, compartment syndrome, contusion, strain/sprain: These are considered  less likely due to history of present illness, physical exam, labs/imaging findings.  Review of prior external notes: 09/05/2023 urgent care  Unique Tests and My Interpretation:  Right pinky toe x-ray: Oblique right pinky toe fracture with minimal displacement and angulation  Social Determinants of Health: uninsured  Discussion with Independent Historian: None  Discussion of Management of Tests: None  Risk: Medium: prescription drug management  Risk Stratification Score: None  Plan: On exam patient was in no acute distress with stable vitals.  On exam patient is neuro vas intact but was tender to his right pinky toe.  I do not appreciate any step-off or crepitus.  There did not appear to be any signs of open wounds.  There were no  overlying skin color changes and patient's sensation was intact distally.  X-ray per my independent interpretation does show oblique fracture with minimal displacement.  Will buddy tape toe and have him follow-up with orthopedics and given crutches along with a few days where the pain meds.  Patient given work note as well.  Encourage patient to ice, rest, OTCs every 6 hours needed for pain.  PDMP reviewed.  Patient was given return precautions. Patient stable for discharge at this time.  Patient verbalized understanding of plan.  This chart was dictated using voice recognition software.  Despite best efforts to proofread,  errors can occur which can change the documentation meaning.         Final Clinical Impression(s) / ED Diagnoses Final diagnoses:  Closed displaced fracture of middle phalanx of lesser toe of right foot, initial encounter    Rx / DC Orders ED Discharge Orders          Ordered    oxyCODONE  (ROXICODONE ) 5 MG immediate release tablet  Every 4 hours PRN        09/05/23 2025              Victor Lynwood [name] 09/05/23 2034    Yolande Lamar BROCKS, MD 09/07/23 [phone]

## 2023-09-05 NOTE — ED Provider Notes (Signed)
 Patient presented today with right fifth toe injury that occurred earlier this morning.  He reports he has numbness in his toe.  The toe is offset on exam and appears to need resetting.  Recommended further evaluation by Ortho and information given for Ortho urgent care open at this time.  Recommended he report there after leaving this urgent care office.   Billy Asberry FALCON, PA-C 09/05/23 [phone]

## 2024-08-31 ENCOUNTER — Encounter (HOSPITAL_COMMUNITY): Payer: Self-pay

## 2024-08-31 ENCOUNTER — Ambulatory Visit (HOSPITAL_COMMUNITY)
Admission: EM | Admit: 2024-08-31 | Discharge: 2024-08-31 | Disposition: A | Discharge status: Home / Self Care | Payer: Self-pay | Attending: Internal Medicine | Admitting: Internal Medicine

## 2024-08-31 ENCOUNTER — Ambulatory Visit (INDEPENDENT_AMBULATORY_CARE_PROVIDER_SITE_OTHER): Payer: Self-pay

## 2024-08-31 DIAGNOSIS — S62314A Displaced fracture of base of fourth metacarpal bone, right hand, initial encounter for closed fracture: Secondary | ICD-10-CM

## 2024-08-31 MED ORDER — KETOROLAC TROMETHAMINE 30 MG/ML IJ SOLN
30.0000 mg | Freq: Once | INTRAMUSCULAR | Status: AC
  Administered 2024-08-31: 30 mg via INTRAMUSCULAR

## 2024-08-31 MED ORDER — KETOROLAC TROMETHAMINE 30 MG/ML IJ SOLN
INTRAMUSCULAR | Status: AC
  Filled 2024-08-31: qty 1

## 2024-08-31 MED ORDER — HYDROCODONE-ACETAMINOPHEN 5-325 MG PO TABS
ORAL_TABLET | ORAL | Status: AC
  Filled 2024-08-31: qty 1

## 2024-08-31 MED ORDER — HYDROCODONE-ACETAMINOPHEN 5-325 MG PO TABS: 1.0000 | ORAL_TABLET | Freq: Four times a day (QID) | ORAL | 0 refills | Status: AC | PRN

## 2024-08-31 MED ORDER — HYDROCODONE-ACETAMINOPHEN 5-325 MG PO TABS
1.0000 | ORAL_TABLET | Freq: Once | ORAL | Status: AC
  Administered 2024-08-31: 1 via ORAL

## 2024-08-31 MED ORDER — KETOROLAC TROMETHAMINE 10 MG PO TABS: 10.0000 mg | ORAL_TABLET | Freq: Four times a day (QID) | ORAL | 0 refills | Status: AC | PRN

## 2024-08-31 NOTE — ED Notes (Signed)
 Ortho notified the need for splint.

## 2024-08-31 NOTE — Discharge Instructions (Addendum)
 You have fractured your hand. We placed your hand in a splint, avoid getting the splint wet. Wear splint at all times and do not remove it until your orthopedic follow-up appointment.  Rest, ice, elevate, and compress the injury to reduce swelling and inflammation.   We gave you an injection of ketorolac  today. This is a strong antiinflammatory medicine.  You may start taking ketorolac  pills (1 10mg  tablet every 6 hours as needed for pain) starting tomorrow.   If your pain is severe, you may take hydrocodone -acetaminophen  1 tablet every 6 hours as needed for severe pain. This medicine will make you sleepy, so mostly take this at bedtime for severe pain. Do not drive or drink alcohol while taking this medicine.   Once pain is controlled with strong pain medicine, switch back to using only ketorolac  for pain.   Do not take any other NSAIDs when taking ketorolac  (aspirin, ibuprofen, naproxen , aleve , etc).   Schedule an appointment with the orthopedic provider listed on your paperwork for follow-up in the next 3-5 days.  Return if you experience worsening pain, numbness, tingling, skin color changes, or any other concerning symptoms. If symptoms are severe, please go to the ER. I hope you feel better!!

## 2024-08-31 NOTE — Progress Notes (Signed)
 Orthopedic Tech Progress Note Patient Details:  Lupe Bogart  08-01-1991 [id number]  Ortho Devices Type of Ortho Device: Ulna gutter splint Ortho Device/Splint Location: RUE Ortho Device/Splint Interventions: Application, Ordered   Post Interventions Patient Tolerated: Well Instructions Provided: Care of device, Poper ambulation with device  Amere Bricco A Kalem Rockwell 08/31/2024, 2:37 PM
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
## 2024-08-31 NOTE — ED Triage Notes (Signed)
 Patient reports that he punched a refrigerator with his right hand last night and now has pain and swelling present.  Patient denies taking any medications for his symptoms.

## 2024-09-01 ENCOUNTER — Ambulatory Visit (HOSPITAL_COMMUNITY): Payer: Self-pay
